# Patient Record
Sex: Female | Born: 1940 | Race: White | Hispanic: No | Marital: Single | Smoking: Former smoker
Health system: Southern US, Community
[De-identification: ages and names within clinical notes are randomized; demographics above are authoritative.]

---

## 2014-03-24 DIAGNOSIS — H538 Other visual disturbances: Secondary | ICD-10-CM | POA: Diagnosis not present

## 2014-03-24 DIAGNOSIS — H3129 Other hereditary choroidal dystrophy: Secondary | ICD-10-CM | POA: Diagnosis not present

## 2014-03-24 DIAGNOSIS — H0019 Chalazion unspecified eye, unspecified eyelid: Secondary | ICD-10-CM | POA: Diagnosis not present

## 2014-03-24 DIAGNOSIS — H01009 Unspecified blepharitis unspecified eye, unspecified eyelid: Secondary | ICD-10-CM | POA: Diagnosis not present

## 2014-03-24 DIAGNOSIS — H43399 Other vitreous opacities, unspecified eye: Secondary | ICD-10-CM | POA: Diagnosis not present

## 2014-03-24 DIAGNOSIS — H47329 Drusen of optic disc, unspecified eye: Secondary | ICD-10-CM | POA: Diagnosis not present

## 2014-03-24 DIAGNOSIS — H2510 Age-related nuclear cataract, unspecified eye: Secondary | ICD-10-CM | POA: Diagnosis not present

## 2014-04-21 DIAGNOSIS — H3129 Other hereditary choroidal dystrophy: Secondary | ICD-10-CM | POA: Diagnosis not present

## 2014-04-21 DIAGNOSIS — H01024 Squamous blepharitis left upper eyelid: Secondary | ICD-10-CM | POA: Diagnosis not present

## 2014-04-21 DIAGNOSIS — H01021 Squamous blepharitis right upper eyelid: Secondary | ICD-10-CM | POA: Diagnosis not present

## 2014-11-24 DIAGNOSIS — H47323 Drusen of optic disc, bilateral: Secondary | ICD-10-CM | POA: Diagnosis not present

## 2014-11-24 DIAGNOSIS — H3129 Other hereditary choroidal dystrophy: Secondary | ICD-10-CM | POA: Diagnosis not present

## 2014-11-24 DIAGNOSIS — H2513 Age-related nuclear cataract, bilateral: Secondary | ICD-10-CM | POA: Diagnosis not present

## 2014-11-24 DIAGNOSIS — H01021 Squamous blepharitis right upper eyelid: Secondary | ICD-10-CM | POA: Diagnosis not present

## 2014-11-24 DIAGNOSIS — H01024 Squamous blepharitis left upper eyelid: Secondary | ICD-10-CM | POA: Diagnosis not present

## 2014-11-24 DIAGNOSIS — H40033 Anatomical narrow angle, bilateral: Secondary | ICD-10-CM | POA: Diagnosis not present

## 2015-07-18 ENCOUNTER — Ambulatory Visit (INDEPENDENT_AMBULATORY_CARE_PROVIDER_SITE_OTHER): Payer: Medicare Other

## 2015-07-18 ENCOUNTER — Ambulatory Visit (INDEPENDENT_AMBULATORY_CARE_PROVIDER_SITE_OTHER): Payer: Medicare Other | Admitting: Emergency Medicine

## 2015-07-18 VITALS — BP 142/88 | HR 126 | Temp 98.5°F | Resp 16 | Ht 60.0 in | Wt 114.0 lb

## 2015-07-18 DIAGNOSIS — S62172A Displaced fracture of trapezium [larger multangular], left wrist, initial encounter for closed fracture: Secondary | ICD-10-CM

## 2015-07-18 DIAGNOSIS — M79642 Pain in left hand: Secondary | ICD-10-CM

## 2015-07-18 MED ORDER — HYDROCODONE-ACETAMINOPHEN 5-325 MG PO TABS: 1.0000 | ORAL_TABLET | ORAL | Status: AC | PRN

## 2015-07-18 NOTE — Progress Notes (Signed)
Right ulnar gutter with thumb spica applied per Dr. Ewell PoeAnderson's VO.  Deliah BostonMichael Quana Chamberlain, MS, PA-C 1:47 PM, 07/18/2015

## 2015-07-18 NOTE — Progress Notes (Signed)
Subjective:  Patient ID: Valerie Bender, female    DOB: 12/09/1940  Age: 74 y.o. MRN: [id number]  CC: Wrist Injury   HPI Valerie Bender presents  patient is no direct history of injury to her hand or thumb. She has after slipping on a stairway she has marked pain and inability to move the base of her left thumb. She has marked swelling of her hand dorsally. She has no erythema or ecchymosis.  History Valerie Bender has no past medical history on file.   She has no past surgical history on file.   Her  family history is not on file.  She   reports that she has quit smoking. She does not have any smokeless tobacco history on file. She reports that she drinks about 3.0 oz of alcohol per week. She reports that she does not use illicit drugs.  No outpatient prescriptions prior to visit.   No facility-administered medications prior to visit.    Social History   Social History  . Marital Status: Single    Spouse Name: N/A  . Number of Children: N/A  . Years of Education: N/A   Social History Main Topics  . Smoking status: Former Games developermoker  . Smokeless tobacco: None  . Alcohol Use: 3.0 oz/week    5 Standard drinks or equivalent per week  . Drug Use: No  . Sexual Activity: Not Asked   Other Topics Concern  . None   Social History Narrative  . None     Review of Systems  Constitutional: Negative for fever, chills and appetite change.  HENT: Negative for congestion, ear pain, postnasal drip, sinus pressure and sore throat.   Eyes: Negative for pain and redness.  Respiratory: Negative for cough, shortness of breath and wheezing.   Cardiovascular: Negative for leg swelling.  Gastrointestinal: Negative for nausea, vomiting, abdominal pain, diarrhea, constipation and blood in stool.  Endocrine: Negative for polyuria.  Genitourinary: Negative for dysuria, urgency, frequency and flank pain.  Musculoskeletal: Negative for gait problem.  Skin: Negative for rash.  Neurological: Negative  for weakness and headaches.  Psychiatric/Behavioral: Negative for confusion and decreased concentration. The patient is not nervous/anxious.     Objective:  BP 142/88 mmHg  Pulse 126  Temp(Src) 98.5 F (36.9 C) (Oral)  Resp 16  Ht 5' (1.524 m)  Wt 114 lb (51.71 kg)  BMI 22.26 kg/m2  SpO2 96%  Physical Exam  Constitutional: She is oriented to person, place, and time. She appears well-developed and well-nourished. No distress.  HENT:  Head: Normocephalic and atraumatic.  Right Ear: External ear normal.  Left Ear: External ear normal.  Nose: Nose normal.  Eyes: Conjunctivae and EOM are normal. Pupils are equal, round, and reactive to light. No scleral icterus.  Neck: Normal range of motion. Neck supple. No tracheal deviation present.  Cardiovascular: Normal rate, regular rhythm and normal heart sounds.   Pulmonary/Chest: Effort normal. No respiratory distress. She has no wheezes. She has no rales.  Abdominal: She exhibits no mass. There is no tenderness. There is no rebound and no guarding.  Musculoskeletal: She exhibits no edema.       Right hand: She exhibits decreased range of motion, tenderness and swelling.  Lymphadenopathy:    She has no cervical adenopathy.  Neurological: She is alert and oriented to person, place, and time. Coordination normal.  Skin: Skin is warm and dry. No rash noted.  Psychiatric: She has a normal mood and affect. Her behavior is normal.  Assessment & Plan:   Valerie Bender was seen today for wrist injury.  Diagnoses and all orders for this visit:  Hand pain, left -     DG Hand Complete Left; Future -     DG Wrist Complete Left; Future  Fracture of trapezium, left, closed, initial encounter -     Ambulatory referral to Orthopedic Surgery  Other orders -     HYDROcodone-acetaminophen (NORCO) 5-325 MG tablet; Take 1-2 tablets by mouth every 4 (four) hours as needed.  I am having Valerie Bender start on HYDROcodone-acetaminophen.  Meds ordered this  encounter  Medications  . HYDROcodone-acetaminophen (NORCO) 5-325 MG tablet    Sig: Take 1-2 tablets by mouth every 4 (four) hours as needed.    Dispense:  30 tablet    Refill:  0    Appropriate red flag conditions were discussed with the patient as well as actions that should be taken.  Patient expressed his understanding.  Follow-up: Return if symptoms worsen or fail to improve.  Carmelina Dane, MD   [facility] reading (PRIMARY) by  Dr. Dareen Piano.  Fracture trapezium.

## 2015-07-18 NOTE — Patient Instructions (Signed)
Metacarpal Fracture °A metacarpal fracture is a break (fracture) of a bone in the hand. Metacarpals are the bones that extend from your knuckles to your wrist. In each hand, you have five metacarpal bones that connect your fingers and your thumb to your wrist. °Some hand fractures have bone pieces that are close together and stable (simple). These fractures may be treated with only a splint or cast. Hand fractures that have many pieces of broken bone (comminuted), unstable bone pieces (displaced), or a bone that breaks through the skin (compound) usually require surgery. °CAUSES °This injury may be caused by: °· A fall. °· A hard, direct hit to your hand. °· An injury that squeezes your knuckle, stretches your finger out of place, or crushes your hand. °RISK FACTORS °This injury is more likely to occur if: °· You play contact sports. °· You have certain bone diseases. °SYMPTOMS  °Symptoms of this type of fracture develop soon after the injury. Symptoms may include: °· Swelling. °· Pain. °· Stiffness. °· Increased pain with movement. °· Bruising. °· Inability to move a finger. °· A shortened finger. °· A finger knuckle that looks sunken in. °· Unusual appearance of the hand or finger (deformity). °DIAGNOSIS  °This injury may be diagnosed based on your signs and symptoms, especially if you had a recent hand injury. Your health care provider will perform a physical exam. He or she may also order X-rays to confirm the diagnosis.  °TREATMENT  °Treatment for this injury depends on the type of fracture you have and how severe it is. Possible treatments include: °· Non-reduction. This can be done if the bone does not need to be moved back into place. The fracture can be casted or splinted as it is.   °· Closed reduction. If your bone is stable and can be moved back into place, you may only need to wear a cast or splint or have buddy taping. °· Closed reduction with internal fixation (CRIF). This is the most common  treatment. You may have this procedure if your bone can be moved back into place but needs more support. Wires, pins, or screws may be inserted through your skin to stabilize the fracture. °· Open reduction with internal fixation (ORIF). This may be needed if your fracture is severe and unstable. It involves surgery to move your bone back into the right position. Screws, wires, or plates are used to stabilize the fracture. °After all procedures, you may need to wear a cast or a splint for several weeks. You will also need to have follow-up X-rays to make sure that the bone is healing well and staying in position. After you no longer need your cast or splint, you may need physical therapy. This will help you to regain full movement and strength in your hand.  °HOME CARE INSTRUCTIONS  °If You Have a Cast: °· Do not stick anything inside the cast to scratch your skin. Doing that increases your risk of infection. °· Check the skin around the cast every day. Report any concerns to your health care provider. You may put lotion on dry skin around the edges of the cast. Do not apply lotion to the skin underneath the cast. °If You Have a Splint: °· Wear it as directed by your health care provider. Remove it only as directed by your health care provider. °· Loosen the splint if your fingers become numb and tingle, or if they turn cold and blue. °Bathing °· Cover the cast or splint with a   watertight plastic bag to protect it from water while you take a bath or a shower. Do not let the cast or splint get wet. °Managing Pain, Stiffness, and Swelling °· If directed, apply ice to the injured area (if you have a splint, not a cast): °¨ Put ice in a plastic bag. °¨ Place a towel between your skin and the bag. °¨ Leave the ice on for 20 minutes, 2-3 times a day. °· Move your fingers often to avoid stiffness and to lessen swelling. °· Raise the injured area above the level of your heart while you are sitting or lying  down. °Driving °· Do not drive or operate heavy machinery while taking pain medicine. °· Do not drive while wearing a cast or splint on a hand that you use for driving. °Activity °· Return to your normal activities as directed by your health care provider. Ask your health care provider what activities are safe for you. °General Instructions °· Do not put pressure on any part of the cast or splint until it is fully hardened. This may take several hours. °· Keep the cast or splint clean and dry. °· Do not use any tobacco products, including cigarettes, chewing tobacco, or electronic cigarettes. Tobacco can delay bone healing. If you need help quitting, ask your health care provider. °· Take medicines only as directed by your health care provider. °· Keep all follow-up visits as directed by your health care provider. This is important. °SEEK MEDICAL CARE IF:  °· Your pain is getting worse. °· You have redness, swelling, or pain in the injured area.   °· You have fluid, blood, or pus coming from under your cast or splint.   °· You notice a bad smell coming from under your cast or splint.   °· You have a fever.   °SEEK IMMEDIATE MEDICAL CARE IF:  °· You develop a rash.   °· You have trouble breathing.   °· Your skin or nails on your injured hand turn blue or gray even after you loosen your splint. °· Your injured hand feels cold or becomes numb even after you loosen your splint.   °· You develop severe pain under the cast or in your hand. °  °This information is not intended to replace advice given to you by your health care provider. Make sure you discuss any questions you have with your health care provider. °  °Document Released: 07/08/2005 Document Revised: 03/29/2015 Document Reviewed: 04/27/2014 °[employer] Interactive Patient Education ©2016 [employer] ° °

## 2015-07-19 DIAGNOSIS — M25532 Pain in left wrist: Secondary | ICD-10-CM | POA: Diagnosis not present

## 2015-10-04 DIAGNOSIS — R030 Elevated blood-pressure reading, without diagnosis of hypertension: Secondary | ICD-10-CM | POA: Diagnosis not present

## 2015-10-04 DIAGNOSIS — M79642 Pain in left hand: Secondary | ICD-10-CM | POA: Diagnosis not present

## 2017-05-22 IMAGING — CR DG HAND COMPLETE 3+V*L*
3 series · 3 of 3 positions shown · non-contrast
Comparison: No prior.

CLINICAL DATA: Swelling.  Initial evaluation.

EXAM:
LEFT HAND - COMPLETE 3+ VIEW

[PA]
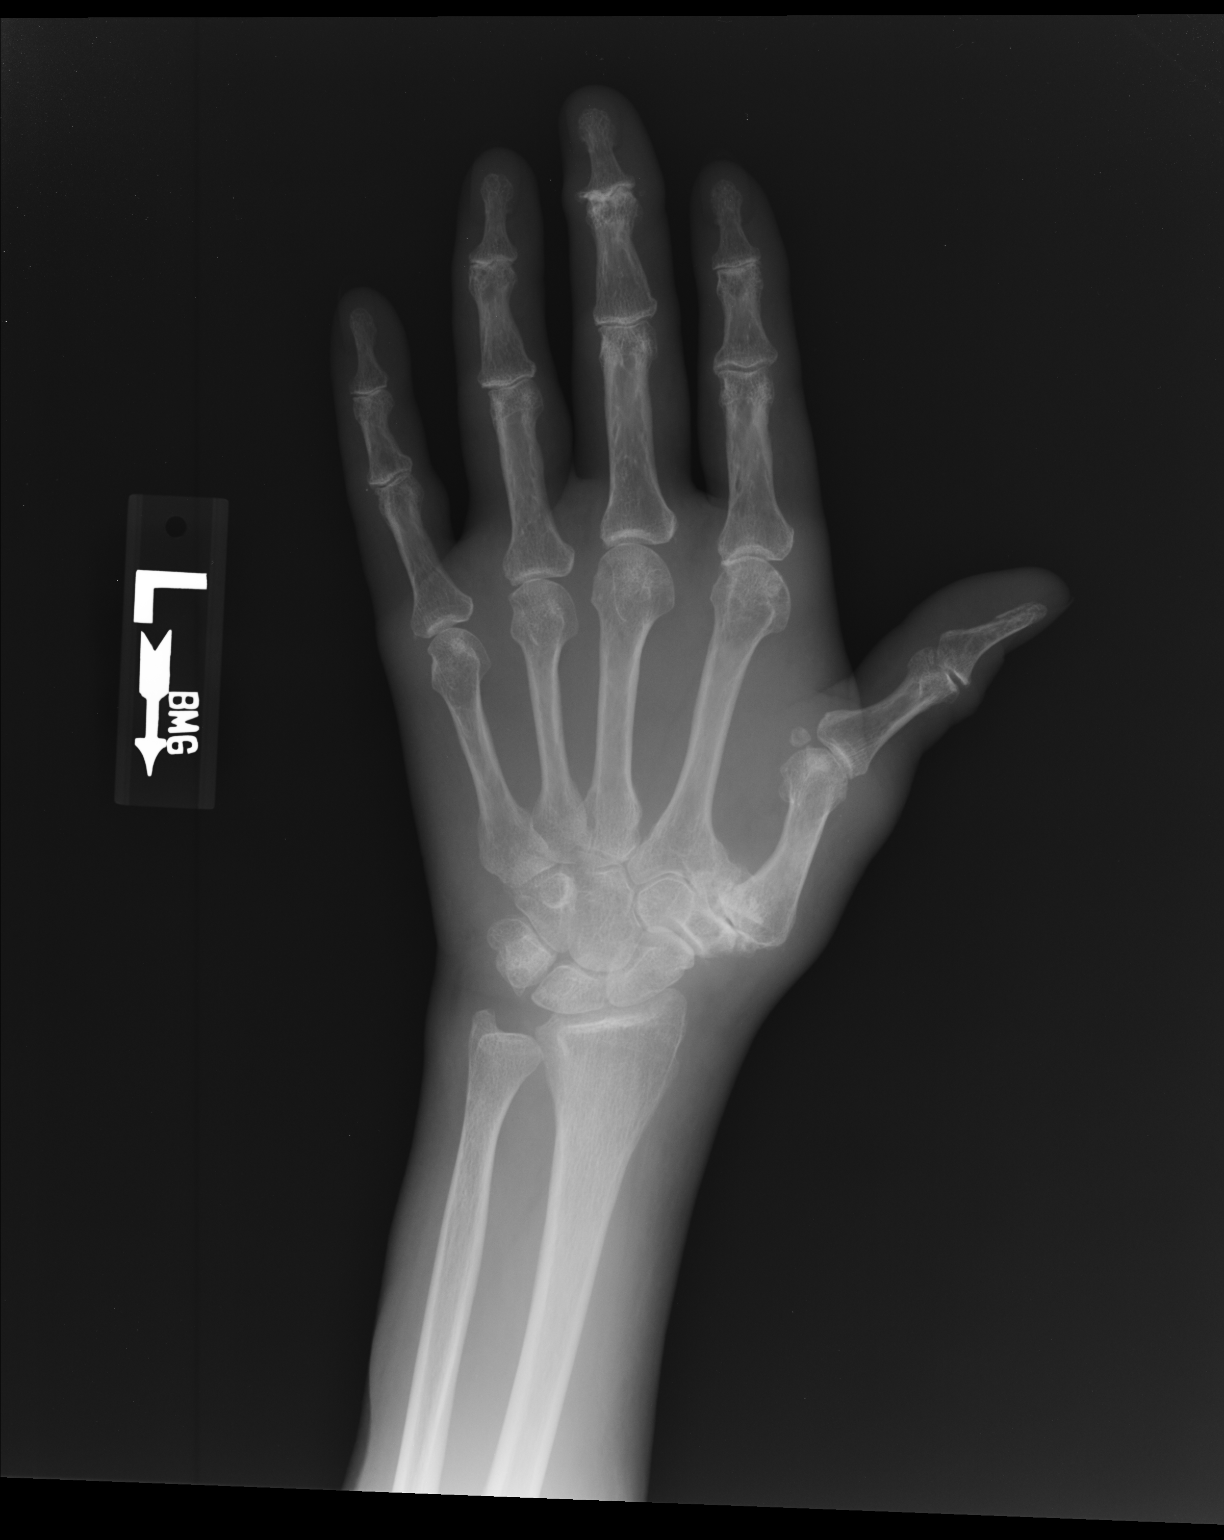

[pa obl]
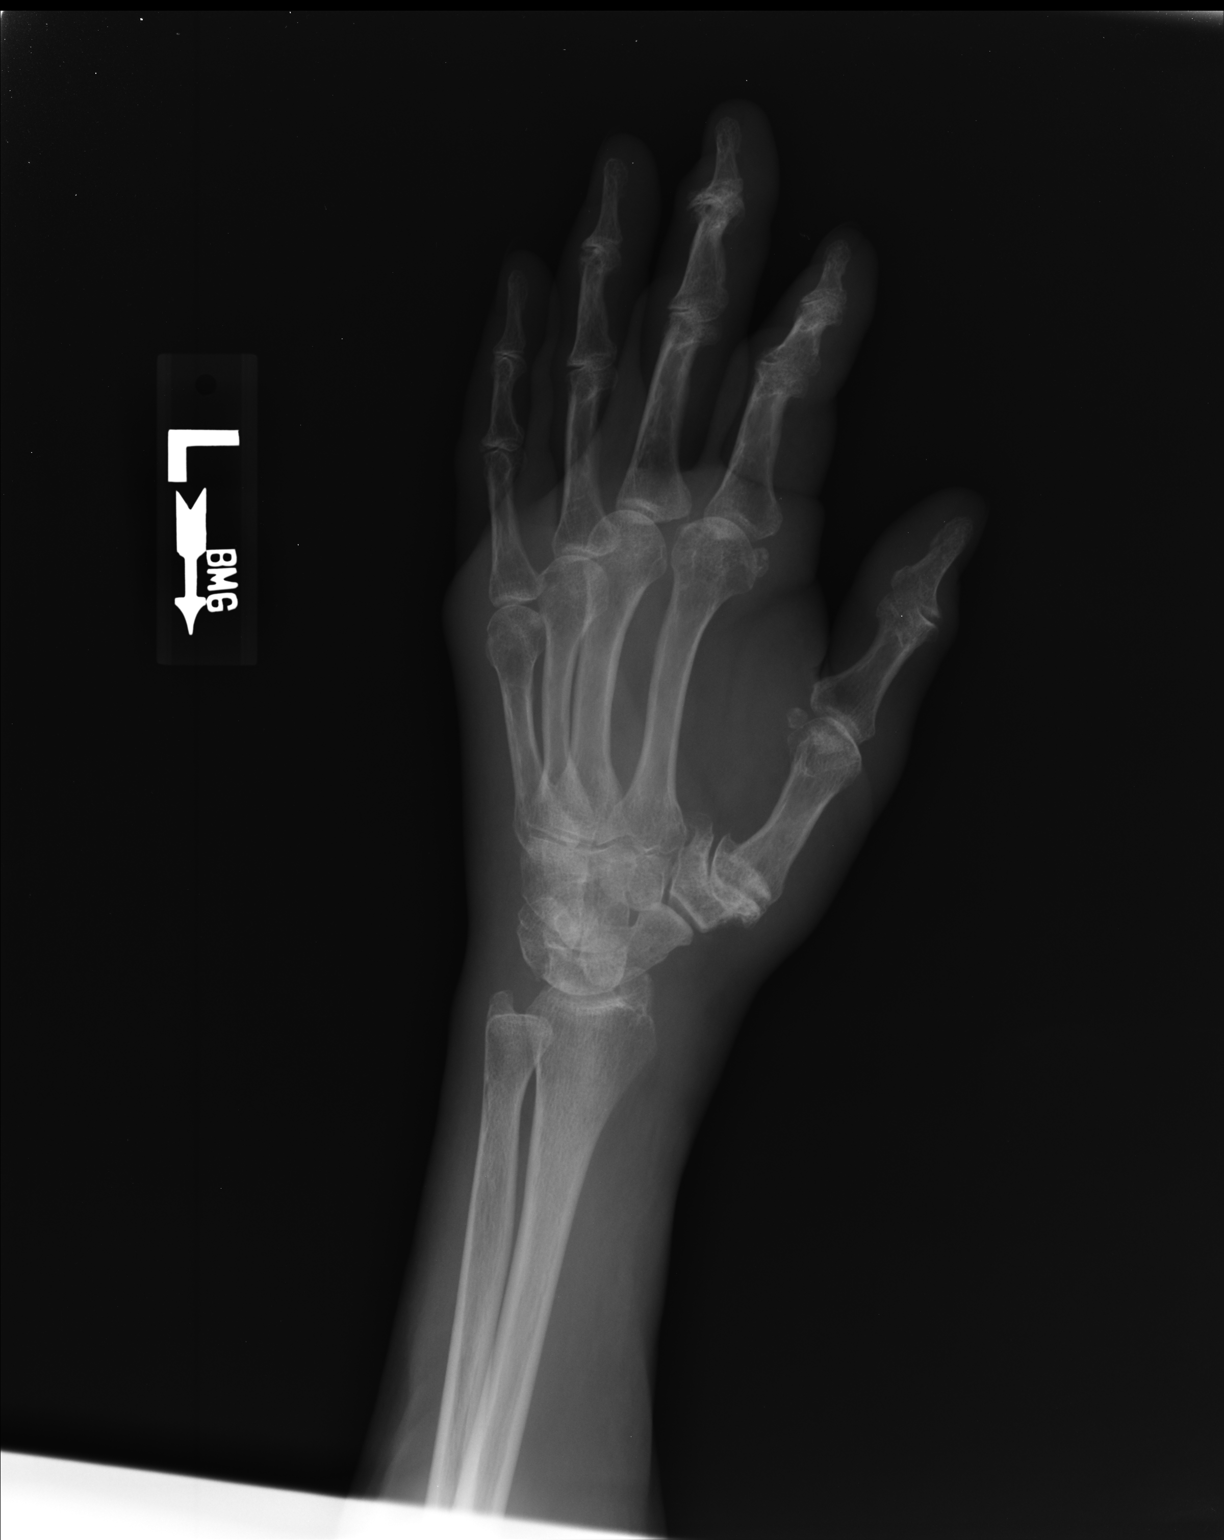

[lateral]
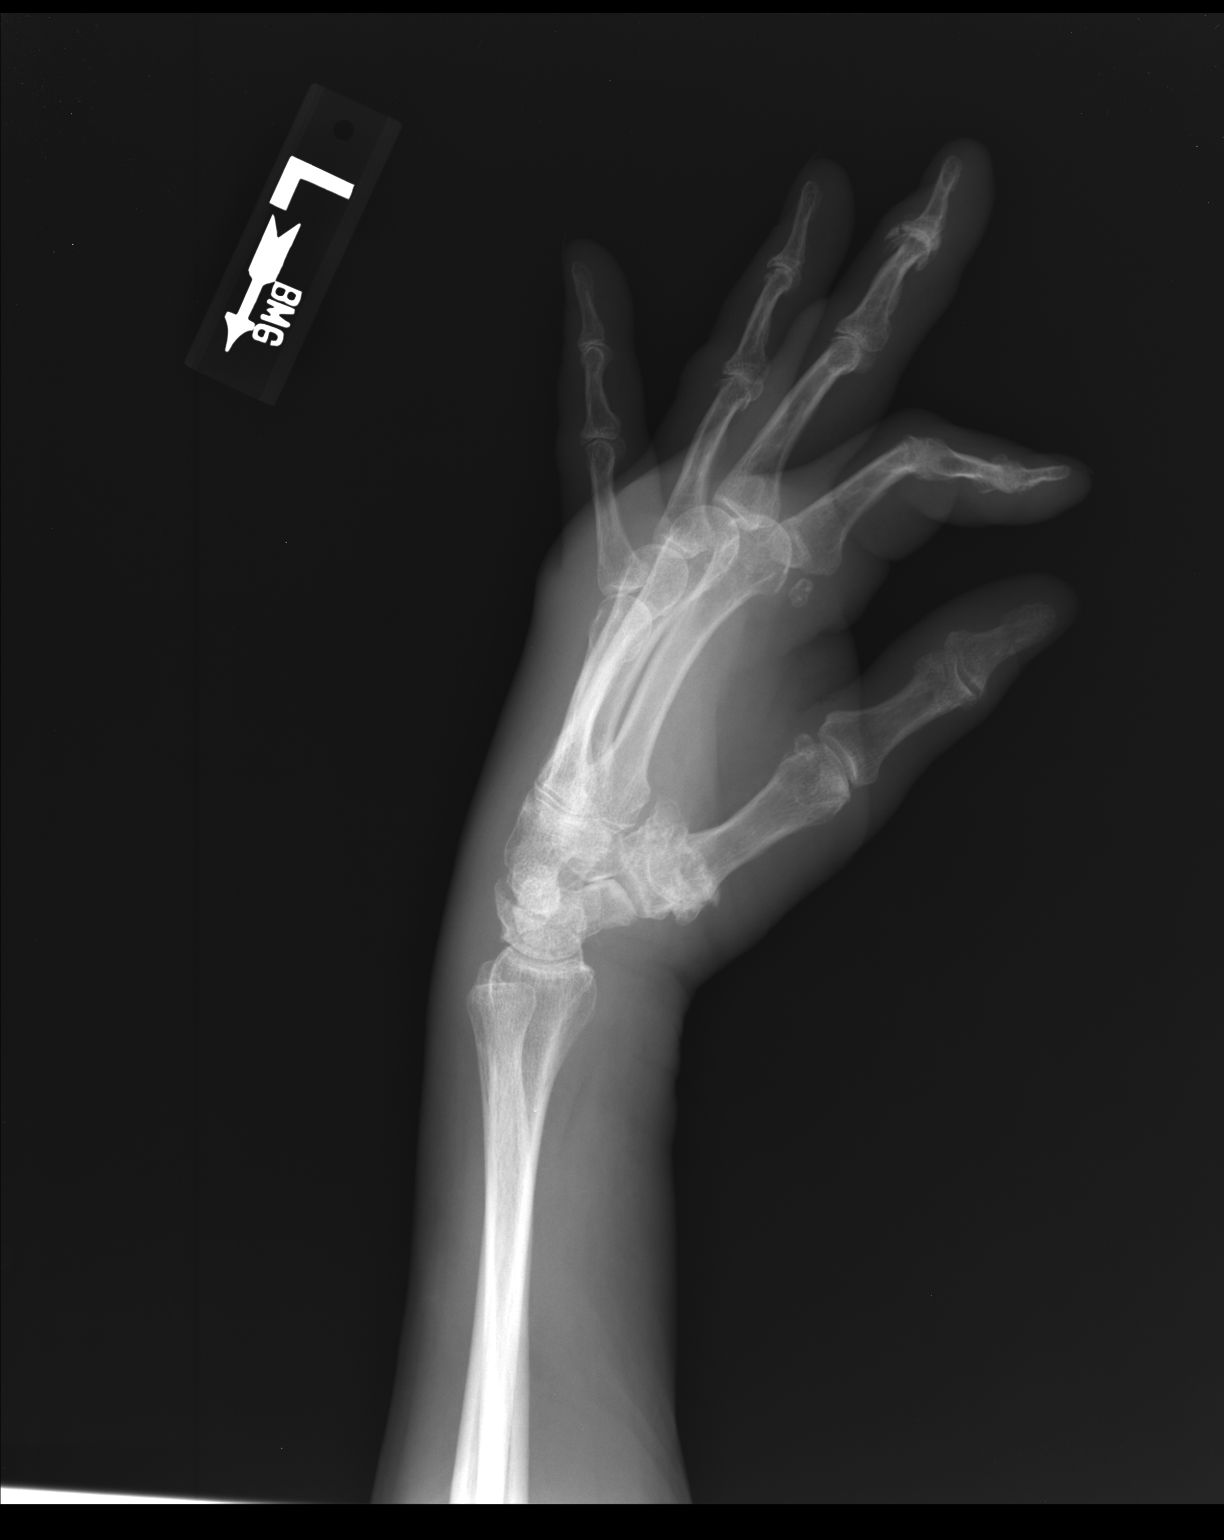

[3 of 3 positions shown; findings below may reference images not displayed]

FINDINGS: Diffuse osteopenia and degenerative change. No evidence of erosive
arthropathy. Degenerative changes most prominent about the first
carpometacarpal joint. No acute bony or joint abnormality
identified.
IMPRESSION: Diffuse osteopenia and degenerative change.
